# Patient Record
Sex: Female | Born: 2005 | Race: Black or African American | Hispanic: No | Marital: Single | Smoking: Never smoker
Health system: Southern US, Community
[De-identification: ages and names within clinical notes are randomized; demographics above are authoritative.]

## PROBLEM LIST (undated history)

## (undated) DIAGNOSIS — J45909 Unspecified asthma, uncomplicated: Secondary | ICD-10-CM

---
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
## 2023-05-09 ENCOUNTER — Ambulatory Visit (HOSPITAL_COMMUNITY)
Admission: EM | Admit: 2023-05-09 | Discharge: 2023-05-09 | Disposition: A | Discharge status: Home / Self Care | Payer: Self-pay | Attending: Emergency Medicine | Admitting: Emergency Medicine

## 2023-05-09 ENCOUNTER — Encounter (HOSPITAL_COMMUNITY): Payer: Self-pay

## 2023-05-09 DIAGNOSIS — M545 Low back pain, unspecified: Secondary | ICD-10-CM

## 2023-05-09 LAB — POCT URINALYSIS DIP (MANUAL ENTRY)
Bilirubin, UA: NEGATIVE
Blood, UA: NEGATIVE
Glucose, UA: NEGATIVE mg/dL
Ketones, POC UA: NEGATIVE mg/dL
Leukocytes, UA: NEGATIVE
Nitrite, UA: NEGATIVE
Protein Ur, POC: NEGATIVE mg/dL
Spec Grav, UA: 1.025 (ref 1.010–1.025)
Urobilinogen, UA: 0.2 U/dL
pH, UA: 7 (ref 5.0–8.0)

## 2023-05-09 MED ORDER — IBUPROFEN 600 MG PO TABS: 600.0000 mg | ORAL_TABLET | Freq: Three times a day (TID) | ORAL | 1 refills | Status: AC | PRN

## 2023-05-09 NOTE — ED Triage Notes (Signed)
 Patient states lower back pain x 2 weeks. Patient denies any recent falls or trauma to her back.

## 2023-05-09 NOTE — Discharge Instructions (Addendum)
 You have been diagnosed with low back pain most likely related to repetitious work.  You need to consider stretching every day twice a day at times.   Use topical ointments or patches such as Salonpas, Aspercreme with lidocaine, Bengay.   Consider massages.  NSAID of choice such as Motrin  600 mg twice daily, naproxen twice daily, Tylenol 1000 mg 3 times daily.  Do not take Motrin  and naproxen together but you can take anything with Tylenol.  Take all medications with food.

## 2023-07-04 NOTE — ED Provider Notes (Signed)
 [facility]    CSN: [id number] Arrival date & time: 05/09/23  1521      History   Chief Complaint No chief complaint on file.   HPI Tracy Taylor is a 18 y.o. female.   Patient states lower back pain x 2 weeks. Patient denies any recent falls or trauma to her back. Pain is generalized aching and does not radiate.  Nothing makes it worse, has taken tylenol at home.      The history is provided by the patient.    Past Medical History:  Diagnosis Date   Asthma     There are no active problems to display for this patient.   History reviewed. No pertinent surgical history.  OB History   No obstetric history on file.      Home Medications    Prior to Admission medications   Medication Sig Start Date End Date Taking? Authorizing Provider  ibuprofen (ADVIL) 600 MG tablet Take 1 tablet (600 mg total) by mouth 3 (three) times daily as needed for moderate pain (pain score 4-6). 05/09/23  Yes Tracy Taylor, Tracy Gillis, NP  cyclobenzaprine (FLEXERIL) 10 MG tablet Take 1 tablet (10 mg total) by mouth at bedtime. 10/03/22   Tracy Hoar, NP    Family History History reviewed. No pertinent family history.  Social History Social History   Tobacco Use   Smoking status: Never   Smokeless tobacco: Never  Vaping Use   Vaping status: Some Days   Substances: Nicotine, Flavoring  Substance Use Topics   Alcohol use: Yes    Comment: socially   Drug use: Never     Allergies   Latex, Penicillins, and Salicylic acid   Review of Systems Review of Systems  Constitutional:  Positive for fatigue.  HENT: Negative.    Genitourinary: Negative.   Musculoskeletal:  Positive for back pain.  All other systems reviewed and are negative.    Physical Exam Triage Vital Signs ED Triage Vitals  Encounter Vitals Group     BP 05/09/23 1538 109/67     Systolic BP Percentile --      Diastolic BP Percentile --      Pulse Rate 05/09/23 1538 (!) 105     Resp 05/09/23 1538 16      Temp 05/09/23 1538 99.4 F (37.4 C)     Temp Source 05/09/23 1538 Oral     SpO2 05/09/23 1538 94 %     Weight --      Height --      Head Circumference --      Peak Flow --      Pain Score 05/09/23 1541 7     Pain Loc --      Pain Education --      Exclude from Growth Chart --    No data found.  Updated Vital Signs BP 109/67 (BP Location: Left Arm)   Pulse (!) 105   Temp 99.4 F (37.4 C) (Oral)   Resp 16   LMP 04/27/2022 (Approximate)   SpO2 94%   Visual Acuity Right Eye Distance:   Left Eye Distance:   Bilateral Distance:    Right Eye Near:   Left Eye Near:    Bilateral Near:     Physical Exam Abdominal:     General: Abdomen is flat.     Palpations: Abdomen is soft.  Musculoskeletal:        General: Tenderness present. Normal range of motion.     Comments: Low  back pain with full ROM.  Some tenderness on palpation with tight muscles noted.    Neurological:     General: No focal deficit present.     Mental Status: She is alert.      UC Treatments / Results  Labs (all labs ordered are listed, but only abnormal results are displayed) Labs Reviewed  POCT URINALYSIS DIP (MANUAL ENTRY)    EKG   Radiology No results found.  Procedures Procedures (including critical care time)  Medications Ordered in UC Medications - No data to display  Initial Impression / Assessment and Plan / UC Course  I have reviewed the triage vital signs and the nursing notes.  Pertinent labs & imaging results that were available during my care of the patient were reviewed by me and considered in my medical decision making (see chart for details).   Patient reporting dull aching generalized lower back pain x 2 weeks. She denies any injury or overuse. She has full  It is noted that she does have a grade temp at this visit of 99.4 and slightly tachycardic at 105.  UA dipstick completed to r/o possible UTI with atypical presentation.  Dipistick is negative.  Patient denies any  other symptoms of illness at this time.  We will treat for back strain.  Continue self care measure of topical ointments and ibuprofen.  She does have muscle relaxants already prescribed.  Encouraged to use medications along with ice.  F/u with PCP for contintued needs.     Final Clinical Impressions(s) / UC Diagnoses   Final diagnoses:  Acute bilateral low back pain without sciatica     Discharge Instructions      You have been diagnosed with low back pain most likely related to repetitious work.  You need to consider stretching every day twice a day at times.   Use topical ointments or patches such as Salonpas, Aspercreme with lidocaine, Bengay.   Consider massages.  NSAID of choice such as Motrin 600 mg twice daily, naproxen twice daily, Tylenol 1000 mg 3 times daily.  Do not take Motrin and naproxen together but you can take anything with Tylenol.  Take all medications with food.   ED Prescriptions     Medication Sig Dispense Auth. Provider   ibuprofen (ADVIL) 600 MG tablet Take 1 tablet (600 mg total) by mouth 3 (three) times daily as needed for moderate pain (pain score 4-6). 30 tablet Tracy Taylor, Tracy Gillis, NP      PDMP not reviewed this encounter.   Tracy Marseille, NP 07/04/23 2005
